# Patient Record
Sex: Male | Born: 2000 | Race: White | Marital: Single | Smoking: Never smoker
Health system: Southern US, Community
[De-identification: ages and names within clinical notes are randomized; demographics above are authoritative.]

## PROBLEM LIST (undated history)

## (undated) DIAGNOSIS — T7840XA Allergy, unspecified, initial encounter: Secondary | ICD-10-CM

## (undated) DIAGNOSIS — S42002A Fracture of unspecified part of left clavicle, initial encounter for closed fracture: Secondary | ICD-10-CM

---
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
## 2021-01-13 DIAGNOSIS — R1032 Left lower quadrant pain: Secondary | ICD-10-CM | POA: Diagnosis not present

## 2021-07-01 DIAGNOSIS — Z419 Encounter for procedure for purposes other than remedying health state, unspecified: Secondary | ICD-10-CM | POA: Diagnosis not present

## 2021-08-01 DIAGNOSIS — Z419 Encounter for procedure for purposes other than remedying health state, unspecified: Secondary | ICD-10-CM | POA: Diagnosis not present

## 2021-08-16 ENCOUNTER — Encounter (HOSPITAL_BASED_OUTPATIENT_CLINIC_OR_DEPARTMENT_OTHER): Payer: Self-pay

## 2021-08-16 ENCOUNTER — Emergency Department (HOSPITAL_BASED_OUTPATIENT_CLINIC_OR_DEPARTMENT_OTHER): Payer: Medicaid Other

## 2021-08-16 ENCOUNTER — Emergency Department (HOSPITAL_BASED_OUTPATIENT_CLINIC_OR_DEPARTMENT_OTHER)
Admission: EM | Admit: 2021-08-16 | Discharge: 2021-08-16 | Disposition: A | Discharge status: Home / Self Care | Payer: Medicaid Other | Attending: Emergency Medicine | Admitting: Emergency Medicine

## 2021-08-16 ENCOUNTER — Emergency Department: Payer: Medicaid Other

## 2021-08-16 ENCOUNTER — Other Ambulatory Visit: Payer: Self-pay

## 2021-08-16 DIAGNOSIS — S0083XA Contusion of other part of head, initial encounter: Secondary | ICD-10-CM

## 2021-08-16 DIAGNOSIS — W228XXA Striking against or struck by other objects, initial encounter: Secondary | ICD-10-CM | POA: Insufficient documentation

## 2021-08-16 DIAGNOSIS — R519 Headache, unspecified: Secondary | ICD-10-CM | POA: Diagnosis not present

## 2021-08-16 DIAGNOSIS — S0990XA Unspecified injury of head, initial encounter: Secondary | ICD-10-CM | POA: Diagnosis present

## 2021-08-16 NOTE — ED Notes (Signed)
Patient transported to CT 
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
## 2021-08-16 NOTE — Discharge Instructions (Signed)
Possible that he suffered mild concussion.  Recommend ice to your forehead.  Recommend bacitracin or Neosporin ointment twice a day to your wounds.  Follow-up with sports medicine Dr. Tamala Julian if needed for questions symptoms.
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
## 2021-09-01 DIAGNOSIS — Z419 Encounter for procedure for purposes other than remedying health state, unspecified: Secondary | ICD-10-CM | POA: Diagnosis not present

## 2021-09-29 DIAGNOSIS — Z419 Encounter for procedure for purposes other than remedying health state, unspecified: Secondary | ICD-10-CM | POA: Diagnosis not present

## 2021-10-30 DIAGNOSIS — Z419 Encounter for procedure for purposes other than remedying health state, unspecified: Secondary | ICD-10-CM | POA: Diagnosis not present

## 2021-11-29 DIAGNOSIS — Z419 Encounter for procedure for purposes other than remedying health state, unspecified: Secondary | ICD-10-CM | POA: Diagnosis not present

## 2021-12-30 DIAGNOSIS — Z419 Encounter for procedure for purposes other than remedying health state, unspecified: Secondary | ICD-10-CM | POA: Diagnosis not present

## 2022-01-29 DIAGNOSIS — Z419 Encounter for procedure for purposes other than remedying health state, unspecified: Secondary | ICD-10-CM | POA: Diagnosis not present

## 2022-03-01 DIAGNOSIS — Z419 Encounter for procedure for purposes other than remedying health state, unspecified: Secondary | ICD-10-CM | POA: Diagnosis not present

## 2022-04-01 DIAGNOSIS — Z419 Encounter for procedure for purposes other than remedying health state, unspecified: Secondary | ICD-10-CM | POA: Diagnosis not present

## 2022-05-01 DIAGNOSIS — Z419 Encounter for procedure for purposes other than remedying health state, unspecified: Secondary | ICD-10-CM | POA: Diagnosis not present

## 2022-06-01 DIAGNOSIS — Z419 Encounter for procedure for purposes other than remedying health state, unspecified: Secondary | ICD-10-CM | POA: Diagnosis not present

## 2022-07-01 DIAGNOSIS — Z419 Encounter for procedure for purposes other than remedying health state, unspecified: Secondary | ICD-10-CM | POA: Diagnosis not present

## 2022-08-01 DIAGNOSIS — Z419 Encounter for procedure for purposes other than remedying health state, unspecified: Secondary | ICD-10-CM | POA: Diagnosis not present

## 2022-09-01 DIAGNOSIS — Z419 Encounter for procedure for purposes other than remedying health state, unspecified: Secondary | ICD-10-CM | POA: Diagnosis not present

## 2022-09-30 DIAGNOSIS — Z419 Encounter for procedure for purposes other than remedying health state, unspecified: Secondary | ICD-10-CM | POA: Diagnosis not present

## 2023-07-24 ENCOUNTER — Ambulatory Visit
Admission: EM | Admit: 2023-07-24 | Discharge: 2023-07-24 | Disposition: A | Discharge status: Home / Self Care | Payer: Self-pay | Attending: Family Medicine | Admitting: Family Medicine

## 2023-07-24 DIAGNOSIS — J039 Acute tonsillitis, unspecified: Secondary | ICD-10-CM

## 2023-07-24 LAB — POCT RAPID STREP A (OFFICE): Rapid Strep A Screen: NEGATIVE

## 2023-07-24 MED ORDER — AMOXICILLIN 875 MG PO TABS: 875.0000 mg | ORAL_TABLET | Freq: Two times a day (BID) | ORAL | 0 refills | Status: AC

## 2023-07-24 NOTE — ED Provider Notes (Signed)
[facility]    CSN: [id number] Arrival date & time: 07/24/23  1133      History   Chief Complaint No chief complaint on file.   HPI Larry Reynolds is a 22 y.o. male.   Presenting today with 2-day history of progressively worsening left tonsillar pain, swelling and painful swollen lymph nodes on the side.  States it is now becoming a bit difficult to swallow.  Denies fever, chills, chest pain, shortness of breath, abdominal pain, nausea vomiting or diarrhea.  Trying viscous lidocaine, over-the-counter pain relievers with minimal relief.    Past Medical History:  Diagnosis Date   Allergy    Fracture of left clavicle     Patient Active Problem List   Diagnosis Date Noted   Fracture of left clavicle in pediatric patient 12/09/2014    Past Surgical History:  Procedure Laterality Date   FINGER TENDON REPAIR Left 2012   ORIF CLAVICULAR FRACTURE Left 12/16/2014   Procedure: OPEN REDUCTION INTERNAL FIXATION (ORIF) LEFT CLAVICLE FRACTURE;  Surgeon: Salvatore Marvel, MD;  Location: [facility];  Service: Orthopedics;  Laterality: Left;       Home Medications    Prior to Admission medications   Medication Sig Start Date End Date Taking? Authorizing Provider  amoxicillin (AMOXIL) 875 MG tablet Take 1 tablet (875 mg total) by mouth 2 (two) times daily. 07/24/23  Yes Particia Nearing, PA-C  HYDROcodone-acetaminophen (NORCO) 5-325 MG per tablet Take 1 tablet by mouth every 6 (six) hours as needed for moderate pain. 12/16/14   Shepperson, Kirstin, PA-C  loratadine (CLARITIN) 10 MG tablet Take 10 mg by mouth daily.    [provider]    Family History Family History  Adopted: Yes    Social History Social History   Tobacco Use   Smoking status: Never  Substance Use Topics   Alcohol use: No    Alcohol/week: 0.0 standard drinks of alcohol   Drug use: No     Allergies   Patient has no known allergies.   Review of Systems Review  of Systems Per HPI  Physical Exam Triage Vital Signs ED Triage Vitals  Encounter Vitals Group     BP 07/24/23 1323 111/65     Systolic BP Percentile --      Diastolic BP Percentile --      Pulse Rate 07/24/23 1323 73     Resp 07/24/23 1323 16     Temp 07/24/23 1323 98.1 F (36.7 C)     Temp Source 07/24/23 1323 Oral     SpO2 07/24/23 1323 98 %     Weight --      Height --      Head Circumference --      Peak Flow --      Pain Score 07/24/23 1324 8     Pain Loc --      Pain Education --      Exclude from Growth Chart --    No data found.  Updated Vital Signs BP 111/65 (BP Location: Right Arm)   Pulse 73   Temp 98.1 F (36.7 C) (Oral)   Resp 16   SpO2 98%   Visual Acuity Right Eye Distance:   Left Eye Distance:   Bilateral Distance:    Right Eye Near:   Left Eye Near:    Bilateral Near:     Physical Exam Vitals and nursing note reviewed.  Constitutional:      Appearance: Normal appearance.  HENT:  Head: Atraumatic.     Mouth/Throat:     Mouth: Mucous membranes are moist.     Pharynx: Oropharyngeal exudate and posterior oropharyngeal erythema present.     Comments: Significant left-sided tonsillar erythema, edema, copious exudates.  Uvula midline, oral airway patent Eyes:     Extraocular Movements: Extraocular movements intact.     Conjunctiva/sclera: Conjunctivae normal.  Cardiovascular:     Rate and Rhythm: Normal rate and regular rhythm.  Pulmonary:     Effort: Pulmonary effort is normal.     Breath sounds: Normal breath sounds.  Musculoskeletal:        General: Normal range of motion.     Cervical back: Normal range of motion and neck supple.  Lymphadenopathy:     Cervical: Cervical adenopathy present.  Skin:    General: Skin is warm and dry.  Neurological:     General: No focal deficit present.     Mental Status: He is oriented to person, place, and time.  Psychiatric:        Mood and Affect: Mood normal.        Thought Content: Thought  content normal.        Judgment: Judgment normal.      UC Treatments / Results  Labs (all labs ordered are listed, but only abnormal results are displayed) Labs Reviewed  POCT RAPID STREP A (OFFICE) - Normal    EKG   Radiology No results found.  Procedures Procedures (including critical care time)  Medications Ordered in UC Medications - No data to display  Initial Impression / Assessment and Plan / UC Course  I have reviewed the triage vital signs and the nursing notes.  Pertinent labs & imaging results that were available during my care of the patient were reviewed by me and considered in my medical decision making (see chart for details).     Rapid strep negative but given symptoms and exam findings will cover for bacterial tonsillitis with amoxicillin, viscous lidocaine, supportive over-the-counter medications and home care.  Return for worsening symptoms.  Final Clinical Impressions(s) / UC Diagnoses   Final diagnoses:  Acute tonsillitis, unspecified etiology   Discharge Instructions   None    ED Prescriptions     Medication Sig Dispense Auth. Provider   amoxicillin (AMOXIL) 875 MG tablet Take 1 tablet (875 mg total) by mouth 2 (two) times daily. 20 tablet Particia Nearing, [place]      PDMP not reviewed this encounter.   Particia Nearing, [place] 07/24/23 1359

## 2023-07-24 NOTE — ED Triage Notes (Signed)
Pt reports he has a sore throat and white film on back of throat, trouble swallowing x2 days
